# Patient Record
Sex: Female | Born: 1990 | Hispanic: Yes | Marital: Married | Smoking: Never smoker
Health system: Southern US, Community
[De-identification: ages and names within clinical notes are randomized; demographics above are authoritative.]

## PROBLEM LIST (undated history)

## (undated) DIAGNOSIS — D649 Anemia, unspecified: Secondary | ICD-10-CM

## (undated) DIAGNOSIS — R7303 Prediabetes: Secondary | ICD-10-CM

## (undated) HISTORY — DX: Anemia, unspecified: D64.9

## (undated) HISTORY — DX: Prediabetes: R73.03

---
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
## 2018-11-17 ENCOUNTER — Other Ambulatory Visit: Payer: Self-pay

## 2018-11-17 DIAGNOSIS — Z20822 Contact with and (suspected) exposure to covid-19: Secondary | ICD-10-CM

## 2018-11-19 LAB — SPECIMEN STATUS REPORT

## 2018-11-19 LAB — NOVEL CORONAVIRUS, NAA: SARS-CoV-2, NAA: NOT DETECTED

## 2020-12-21 ENCOUNTER — Other Ambulatory Visit: Payer: Self-pay

## 2020-12-21 ENCOUNTER — Ambulatory Visit
Admission: RE | Admit: 2020-12-21 | Discharge: 2020-12-21 | Disposition: A | Discharge status: Home / Self Care | Payer: Self-pay | Source: Ambulatory Visit | Attending: Emergency Medicine | Admitting: Emergency Medicine

## 2020-12-21 VITALS — BP 146/90 | HR 99 | Temp 98.6°F | Resp 16

## 2020-12-21 DIAGNOSIS — N390 Urinary tract infection, site not specified: Secondary | ICD-10-CM

## 2020-12-21 LAB — POCT URINALYSIS DIP (MANUAL ENTRY)
Bilirubin, UA: NEGATIVE
Glucose, UA: NEGATIVE mg/dL
Ketones, POC UA: NEGATIVE mg/dL
Nitrite, UA: NEGATIVE
Protein Ur, POC: 100 mg/dL — AB
Spec Grav, UA: 1.025 (ref 1.010–1.025)
Urobilinogen, UA: 0.2 E.U./dL
pH, UA: 6 (ref 5.0–8.0)

## 2020-12-21 MED ORDER — CEPHALEXIN 500 MG PO CAPS: 500.0000 mg | ORAL_CAPSULE | Freq: Two times a day (BID) | ORAL | 0 refills | Status: AC

## 2020-12-21 NOTE — Discharge Instructions (Signed)

## 2020-12-21 NOTE — ED Provider Notes (Signed)
Subjective:    Kristine Sanchez is a very pleasant 30 y.o. female who presents with concerns for UTI due to dysuria, urinary frequency, and lower abdominal pain for about a week. No unilateral back pain, vomiting, fever, vaginal discharge, concern for STD.  Past medical history, past surgical history, current medications reviewed.  Allergies: has No Known Allergies.  Review of Systems See HPI   Objective:     Vitals:   12/21/20 1121  BP: (!) 146/90  Pulse: 99  Resp: 16  Temp: 98.6 F (37 C)  SpO2: 99%     General: Appears well-developed and well-nourished. No acute distress.  Cardiovascular: Normal rate Pulm/Chest: No respiratory distress Abdominal: No CVAT.  Neurological: Alert and oriented to person, place, and time.  Skin: Skin is warm and dry.  Psychiatric: Normal mood, affect, behavior, and thought content.  GU:  Deferred secondary to self collect specimen  Laboratory:  Orders Placed This Encounter  Procedures   Urine Culture   POCT urinalysis dipstick   Results for orders placed or performed during the hospital encounter of 12/21/20  POCT urinalysis dipstick  Result Value Ref Range   Color, UA yellow yellow   Clarity, UA cloudy (A) clear   Glucose, UA negative negative mg/dL   Bilirubin, UA negative negative   Ketones, POC UA negative negative mg/dL   Spec Grav, UA 7.628 3.151 - 1.025   Blood, UA moderate (A) negative   pH, UA 6.0 5.0 - 8.0   Protein Ur, POC =100 (A) negative mg/dL   Urobilinogen, UA 0.2 0.2 or 1.0 E.U./dL   Nitrite, UA Negative Negative   Leukocytes, UA Large (3+) (A) Negative     Assessment:   1. Acute UTI - Urine Culture; Standing - Urine Culture - cephALEXin (KEFLEX) 500 MG capsule; Take 1 capsule (500 mg total) by mouth 2 (two) times daily for 7 days.  Dispense: 14 capsule; Refill: 0   Plan:   MDM: Patient presents with concerns for UTI due to dysuria, urinary frequency, and lower abdominal pain for about a week. No  unilateral back pain, vomiting, fever, vaginal discharge, concern for STD.  Urinalysis reveals cloudy urine, moderate blood, 100 protein and large leukocytes.  On chart review there is no history of urine culture, will treat with Keflex and send the patient's urine for culture today.  Advised that we will change treatment if warranted pending culture results.  Given urinalysis results, likely acute UTI.  Advised that she may use Uristat or Azo as needed for discomfort and to increase fluid intake.  Return to clinic or go to the ER with any fever, unilateral back pain or vomiting.  Patient verbalized understanding and agreed with plan.  Patient stable upon discharge.  Return as needed.    Discharge Instructions      You were seen today for an infection in the lower urinary tract. Your urine sample today was sent for a culture. The urine culture will show what type of bacteria grows and if you are on the appropriate antibiotic. If the antibiotic needs to be changed, you will receive a phone call from the follow up nurse who will give you more information. If you do not receive a call, then you are on the correct antibiotic.  Take antibiotics as directed. Finish course even if feeling better sooner. Drink plenty of clear fluids. Over the counter "Uristat" or "Azo Standard" may help your discomfort.  This will turn your urine dark orange or red and may  stain contacts (remove before using). You may experience 24 - 48 hours of continuing discomfort until medication controls the infection.  Return to clinic or go to the ER if you develop a fever, one-sided back pain, or vomiting as these are signs of a worsening infection.         Amalia Greenhouse, FNP 12/21/20 1125

## 2020-12-21 NOTE — ED Triage Notes (Signed)
Patient presents to Urgent Care with complaints of UTI symptoms x 1 week. Pt c/o of dysuria, increased urinary freq. and abdominal pain.   Denies fever or hematuria.

## 2020-12-23 LAB — URINE CULTURE

## 2023-01-26 ENCOUNTER — Ambulatory Visit
Admission: RE | Admit: 2023-01-26 | Discharge: 2023-01-26 | Disposition: A | Discharge status: Home / Self Care | Payer: Managed Care, Other (non HMO) | Source: Ambulatory Visit | Attending: Emergency Medicine | Admitting: Emergency Medicine

## 2023-01-26 VITALS — BP 109/76 | HR 81 | Temp 98.1°F | Resp 18 | Ht 63.0 in | Wt 270.0 lb

## 2023-01-26 DIAGNOSIS — R350 Frequency of micturition: Secondary | ICD-10-CM | POA: Diagnosis present

## 2023-01-26 LAB — POCT URINALYSIS DIP (MANUAL ENTRY)
Bilirubin, UA: NEGATIVE
Glucose, UA: NEGATIVE mg/dL
Ketones, POC UA: NEGATIVE mg/dL
Nitrite, UA: NEGATIVE
Protein Ur, POC: NEGATIVE mg/dL
Spec Grav, UA: 1.01 (ref 1.010–1.025)
Urobilinogen, UA: 0.2 U/dL
pH, UA: 6.5 (ref 5.0–8.0)

## 2023-01-26 MED ORDER — NITROFURANTOIN MONOHYD MACRO 100 MG PO CAPS: 100.0000 mg | ORAL_CAPSULE | Freq: Two times a day (BID) | ORAL | 0 refills | Status: DC

## 2023-01-26 NOTE — ED Provider Notes (Signed)
Renaldo Fiddler    CSN: [id number] Arrival date & time: 01/26/23  1718      History   Chief Complaint Chief Complaint  Patient presents with   Urinary Frequency    Maybe UTI - Entered by patient    HPI Kristine Sanchez is a 32 y.o. female.   Patient presents for evaluation of urinary frequency, urgency and dysuria present for 7 days.  Has not attempted treatment.  Last menstrual period 1 week ago no concern for pregnancy.  Denies all vaginal symptoms, lower abdominal pain or pressure, flank pain or fever.  History reviewed. No pertinent past medical history.  There are no problems to display for this patient.   History reviewed. No pertinent surgical history.  OB History   No obstetric history on file.      Home Medications    Prior to Admission medications   Medication Sig Start Date End Date Taking? Authorizing Provider  nitrofurantoin, macrocrystal-monohydrate, (MACROBID) 100 MG capsule Take 1 capsule (100 mg total) by mouth 2 (two) times daily. 01/26/23  Yes WhiteElita Boone, NP    Family History No family history on file.  Social History Social History   Tobacco Use   Smoking status: Never   Smokeless tobacco: Never  Vaping Use   Vaping status: Never Used  Substance Use Topics   Alcohol use: Never   Drug use: Never     Allergies   Ibuprofen   Review of Systems Review of Systems   Physical Exam Triage Vital Signs ED Triage Vitals  Encounter Vitals Group     BP 01/26/23 1735 109/76     Systolic BP Percentile --      Diastolic BP Percentile --      Pulse Rate 01/26/23 1735 81     Resp 01/26/23 1735 18     Temp 01/26/23 1735 98.1 F (36.7 C)     Temp Source 01/26/23 1735 Oral     SpO2 01/26/23 1735 99 %     Weight 01/26/23 1731 270 lb (122.5 kg)     Height 01/26/23 1731 5\' 3"  (1.6 m)     Head Circumference --      Peak Flow --      Pain Score 01/26/23 1731 6     Pain Loc --      Pain Education --      Exclude from Growth  Chart --    No data found.  Updated Vital Signs BP 109/76 (BP Location: Right Arm)   Pulse 81   Temp 98.1 F (36.7 C) (Oral)   Resp 18   Ht 5\' 3"  (1.6 m)   Wt 270 lb (122.5 kg)   LMP 01/19/2023   SpO2 99%   BMI 47.83 kg/m   Visual Acuity Right Eye Distance:   Left Eye Distance:   Bilateral Distance:    Right Eye Near:   Left Eye Near:    Bilateral Near:     Physical Exam Constitutional:      Appearance: Normal appearance.  Eyes:     Extraocular Movements: Extraocular movements intact.  Pulmonary:     Effort: Pulmonary effort is normal.  Abdominal:     General: Abdomen is flat. Bowel sounds are normal. There is no distension.     Palpations: Abdomen is soft.     Tenderness: There is no abdominal tenderness. There is no right CVA tenderness, left CVA tenderness or guarding.  Neurological:     Mental Status: She  is alert and oriented to person, place, and time. Mental status is at baseline.      UC Treatments / Results  Labs (all labs ordered are listed, but only abnormal results are displayed) Labs Reviewed  POCT URINALYSIS DIP (MANUAL ENTRY) - Abnormal; Notable for the following components:      Result Value   Color, UA light yellow (*)    Blood, UA trace-lysed (*)    Leukocytes, UA Small (1+) (*)    All other components within normal limits  URINE CULTURE    EKG   Radiology No results found.  Procedures Procedures (including critical care time)  Medications Ordered in UC Medications - No data to display  Initial Impression / Assessment and Plan / UC Course  I have reviewed the triage vital signs and the nursing notes.  Pertinent labs & imaging results that were available during my care of the patient were reviewed by me and considered in my medical decision making (see chart for details).  Urinary frequency  Urinalysis showing leukocytes, negative for nitrates, sent for culture, discussed findings with patient, no abdominal tenderness or CVA  tenderness on exam, stable for outpatient management, prophylactically treating as she is symptomatic with Macrobid, discussed additional supportive measures and advised follow-up if symptoms continue to persist worsen or recur Final Clinical Impressions(s) / UC Diagnoses   Final diagnoses:  Urinary frequency     Discharge Instructions      Your urinalysis shows Kristine Sanchez blood cells but at this time shows no bacteria, your urine will be sent to the lab to determine exactly which bacteria is present, if any changes need to be made to your medications you will be notified  Begin use of Macrobid very morning and every evening for 5 days  You may use over-the-counter Pyridium to help minimize your symptoms until antibiotic removes bacteria, this medication will turn your urine orange  Increase your fluid intake through use of water  As always practice good hygiene, wiping front to back and avoidance of scented vaginal products to prevent further irritation  If symptoms continue to persist after use of medication or recur please follow-up with urgent care or your primary doctor as needed    ED Prescriptions     Medication Sig Dispense Auth. Provider   nitrofurantoin, macrocrystal-monohydrate, (MACROBID) 100 MG capsule Take 1 capsule (100 mg total) by mouth 2 (two) times daily. 10 capsule Valinda Hoar, NP      PDMP not reviewed this encounter.   Valinda Hoar, NP 01/26/23 1807

## 2023-01-26 NOTE — ED Triage Notes (Signed)
Patient in office today chief complaint frequent urination,painful with sensation x1wk  WGN:FAOZ  Denies: adb. Pain, fever and n/v

## 2023-01-26 NOTE — Discharge Instructions (Addendum)
Your urinalysis shows Dawsyn Zurn blood cells but at this time shows no bacteria, your urine will be sent to the lab to determine exactly which bacteria is present, if any changes need to be made to your medications you will be notified  Begin use of Macrobid very morning and every evening for 5 days  You may use over-the-counter Pyridium to help minimize your symptoms until antibiotic removes bacteria, this medication will turn your urine orange  Increase your fluid intake through use of water  As always practice good hygiene, wiping front to back and avoidance of scented vaginal products to prevent further irritation  If symptoms continue to persist after use of medication or recur please follow-up with urgent care or your primary doctor as needed

## 2023-01-28 ENCOUNTER — Telehealth: Payer: Self-pay

## 2023-01-28 LAB — URINE CULTURE: Culture: >=100000 — AB

## 2023-01-28 MED ORDER — SULFAMETHOXAZOLE-TRIMETHOPRIM 800-160 MG PO TABS: 1.0000 | ORAL_TABLET | Freq: Two times a day (BID) | ORAL | 0 refills | Status: AC

## 2023-01-28 MED ORDER — PHENAZOPYRIDINE HCL 200 MG PO TABS: 200.0000 mg | ORAL_TABLET | Freq: Three times a day (TID) | ORAL | 0 refills | Status: AC

## 2023-01-28 NOTE — Telephone Encounter (Signed)
TC to pt, ID verified. RE: abx change. Pt adv to d/c current abx and Bactrim BID x 5 days sent to pharmacy on file

## 2023-05-18 ENCOUNTER — Other Ambulatory Visit: Payer: Self-pay | Admitting: Family Medicine

## 2023-05-18 DIAGNOSIS — N6314 Unspecified lump in the right breast, lower inner quadrant: Secondary | ICD-10-CM

## 2023-05-18 DIAGNOSIS — N644 Mastodynia: Secondary | ICD-10-CM

## 2023-05-20 ENCOUNTER — Ambulatory Visit
Admission: RE | Admit: 2023-05-20 | Discharge: 2023-05-20 | Disposition: A | Discharge status: Home / Self Care | Payer: Managed Care, Other (non HMO) | Source: Ambulatory Visit | Attending: Family Medicine | Admitting: Family Medicine

## 2023-05-20 ENCOUNTER — Ambulatory Visit
Admission: RE | Admit: 2023-05-20 | Discharge: 2023-05-20 | Discharge status: Home / Self Care | Payer: Managed Care, Other (non HMO) | Source: Ambulatory Visit | Attending: Family Medicine | Admitting: Family Medicine

## 2023-05-20 DIAGNOSIS — N6314 Unspecified lump in the right breast, lower inner quadrant: Secondary | ICD-10-CM | POA: Diagnosis present

## 2023-05-20 DIAGNOSIS — N644 Mastodynia: Secondary | ICD-10-CM | POA: Diagnosis present

## 2023-05-24 ENCOUNTER — Inpatient Hospital Stay: Payer: Managed Care, Other (non HMO)

## 2023-05-24 ENCOUNTER — Inpatient Hospital Stay: Payer: Managed Care, Other (non HMO) | Attending: Oncology | Admitting: Oncology

## 2023-05-24 ENCOUNTER — Encounter: Payer: Self-pay | Admitting: Oncology

## 2023-05-24 VITALS — BP 138/88 | HR 93 | Temp 98.5°F | Resp 18

## 2023-05-24 DIAGNOSIS — D509 Iron deficiency anemia, unspecified: Secondary | ICD-10-CM | POA: Insufficient documentation

## 2023-05-24 NOTE — Assessment & Plan Note (Signed)
 Labs are reviewed and discussed with patient. Results are consistent with iron deficiency anemia.  I discussed about option IV Venofer treatments. I discussed about the potential risks including but not limited to allergic reactions/infusion reactions including anaphylactic reactions, phlebitis, high blood pressure, wheezing, SOB, skin rash, weight gain,dark urine, leg swelling, back pain, headache, nausea and fatigue, etc.  Plan IV venofer weekly x 4  patient agrees with the plan.

## 2023-05-24 NOTE — Progress Notes (Signed)
 Hematology/Oncology Consult note [phone] [phone]        REFERRING PROVIDER: Alm Bustard, NP   CHIEF COMPLAINTS/REASON FOR VISIT:  Evaluation of iron deficiency anemia.    ASSESSMENT & PLAN:   IDA (iron deficiency anemia) Labs are reviewed and discussed with patient. Results are consistent with iron deficiency anemia.  I discussed about option IV Venofer treatments. I discussed about the potential risks including but not limited to allergic reactions/infusion reactions including anaphylactic reactions, phlebitis, high blood pressure, wheezing, SOB, skin rash, weight gain,dark urine, leg swelling, back pain, headache, nausea and fatigue, etc.  Plan IV venofer weekly x 4  patient agrees with the plan.    Orders Placed This Encounter  Procedures   Pregnancy, urine    Standing Status:   Standing    Number of Occurrences:   5    Expiration Date:   05/23/2024   Follow up in 3 months.  All questions were answered. The patient knows to call the clinic with any problems, questions or concerns.  Rickard Patience, MD, PhD [facility] Hematology Oncology 05/24/2023   HISTORY OF PRESENTING ILLNESS:   Kristine Sanchez is a  33 y.o.  female with PMH listed below was seen in consultation at the request of  Fields, Glenda L, NP  for evaluation of iron deficiency anemia.  She has been experiencing iron deficiency anemia for some time, as indicated by recent blood work. Her hemoglobin level is currently at 11, and her iron level is at 4, which is consistent with iron deficiency. She has been taking oral iron supplements, but her hemoglobin remains low. She has not previously received iron treatments such as infusions.  Her menstrual periods are not excessively heavy, with the first two days being the heaviest, but not prolonged. No history of stomach surgery, stomach pain, blood in the stool, or dark stools, which could indicate gastrointestinal bleeding.  She is  sexually active but not using contraceptive measures, and she has two children. She recalls being told about a heart murmur during a past pregnancy, but no further details were provided.  No stomach pain, blood in the stool, or dark stools. She feels tired.    MEDICAL HISTORY:  Past Medical History:  Diagnosis Date   Anemia    Prediabetes     SURGICAL HISTORY: History reviewed. No pertinent surgical history.  SOCIAL HISTORY: Social History   Socioeconomic History   Marital status: Married    Spouse name: Not on file   Number of children: Not on file   Years of education: Not on file   Highest education level: Not on file  Occupational History   Not on file  Tobacco Use   Smoking status: Never   Smokeless tobacco: Never  Vaping Use   Vaping status: Never Used  Substance and Sexual Activity   Alcohol use: Never   Drug use: Never   Sexual activity: Not on file  Other Topics Concern   Not on file  Social History Narrative   Not on file   Social Drivers of Health   Financial Resource Strain: Low Risk  (02/15/2023)   Received from [facility]   Overall Financial Resource Strain (CARDIA)    Difficulty of Paying Living Expenses: Not hard at all  Food Insecurity: No Food Insecurity (05/24/2023)   Hunger Vital Sign    Worried About Running Out of Food in the Last Year: Never true    Ran Out of Food in  the Last Year: Never true  Transportation Needs: No Transportation Needs (05/24/2023)   PRAPARE - Administrator, Civil Service (Medical): No    Lack of Transportation (Non-Medical): No  Physical Activity: Not on file  Stress: Not on file  Social Connections: Not on file  Intimate Partner Violence: Not At Risk (05/24/2023)   Humiliation, Afraid, Rape, and Kick questionnaire    Fear of Current or Ex-Partner: No    Emotionally Abused: No    Physically Abused: No    Sexually Abused: No    FAMILY HISTORY: Family History  Problem Relation  Age of Onset   Diabetes Father    Breast cancer Neg Hx     ALLERGIES:  is allergic to ibuprofen.  MEDICATIONS:  Current Outpatient Medications  Medication Sig Dispense Refill   ferrous gluconate (FERGON) 324 MG tablet Take 324 mg by mouth daily with breakfast.     Vitamin D, Ergocalciferol, (DRISDOL) 1.25 MG (50000 UNIT) CAPS capsule Take 50,000 Units by mouth once a week.     No current facility-administered medications for this visit.    Review of Systems  Constitutional:  Positive for fatigue. Negative for appetite change, chills and fever.  HENT:   Negative for hearing loss and voice change.   Eyes:  Negative for eye problems.  Respiratory:  Negative for chest tightness and cough.   Cardiovascular:  Negative for chest pain.  Gastrointestinal:  Negative for abdominal distention, abdominal pain and blood in stool.  Endocrine: Negative for hot flashes.  Genitourinary:  Negative for difficulty urinating and frequency.   Musculoskeletal:  Negative for arthralgias.  Skin:  Negative for itching and rash.  Neurological:  Negative for extremity weakness.  Hematological:  Negative for adenopathy.  Psychiatric/Behavioral:  Negative for confusion.    PHYSICAL EXAMINATION: ECOG PERFORMANCE STATUS: 0 - Asymptomatic Vitals:   05/24/23 1539  BP: 138/88  Pulse: 93  Resp: 18  Temp: 98.5 F (36.9 C)   There were no vitals filed for this visit.  Physical Exam Constitutional:      General: She is not in acute distress. HENT:     Head: Normocephalic and atraumatic.  Eyes:     General: No scleral icterus. Cardiovascular:     Rate and Rhythm: Normal rate and regular rhythm.     Heart sounds: Murmur heard.  Pulmonary:     Effort: Pulmonary effort is normal. No respiratory distress.     Breath sounds: No wheezing.  Abdominal:     General: Bowel sounds are normal. There is no distension.     Palpations: Abdomen is soft.  Musculoskeletal:        General: No deformity. Normal range  of motion.     Cervical back: Normal range of motion and neck supple.  Skin:    General: Skin is warm and dry.     Findings: No erythema or rash.  Neurological:     Mental Status: She is alert and oriented to person, place, and time. Mental status is at baseline.     Cranial Nerves: No cranial nerve deficit.     Coordination: Coordination normal.  Psychiatric:        Mood and Affect: Mood normal.     LABORATORY DATA:  I have reviewed the data as listed     No data to display             No data to display  RADIOGRAPHIC STUDIES: I have personally reviewed the radiological images as listed and agreed with the findings in the report. MM 3D DIAGNOSTIC MAMMOGRAM BILATERAL BREAST Result Date: 05/20/2023 CLINICAL DATA:  Here for evaluation of a palpable and tender area felt by the patient in the right breast. EXAM: DIGITAL DIAGNOSTIC BILATERAL MAMMOGRAM WITH TOMOSYNTHESIS AND CAD; ULTRASOUND RIGHT BREAST LIMITED TECHNIQUE: Bilateral digital diagnostic mammography and breast tomosynthesis was performed. The images were evaluated with computer-aided detection. ; Targeted ultrasound examination of the right breast was performed COMPARISON:  This is the patient's baseline mammogram. ACR Breast Density Category a: The breasts are almost entirely fatty. FINDINGS: Spot compression was obtained over the palpable and tender area of concern in the right breast. No suspicious mammographic finding is identified in this area. No suspicious mass, microcalcification, or other finding is identified in either breast. Targeted right breast ultrasound was performed in the palpable and tender area of concern from the 4-6 o'clock position. No suspicious solid or cystic mass is identified. No findings to explain the patient's symptoms. IMPRESSION: No evidence of malignancy or findings to explain the patient's symptoms. RECOMMENDATION: Any further workup of the patient's symptoms should be based on the  clinical assessment. Recommend routine annual screening mammogram beginning at age 65. I have discussed the findings and recommendations with the patient. If applicable, a reminder letter will be sent to the patient regarding the next appointment. BI-RADS CATEGORY  1: Negative. Electronically Signed   By: Romona Curls M.D.   On: 05/20/2023 15:29   [place] LIMITED ULTRASOUND INCLUDING AXILLA RIGHT BREAST Result Date: 05/20/2023 CLINICAL DATA:  Here for evaluation of a palpable and tender area felt by the patient in the right breast. EXAM: DIGITAL DIAGNOSTIC BILATERAL MAMMOGRAM WITH TOMOSYNTHESIS AND CAD; ULTRASOUND RIGHT BREAST LIMITED TECHNIQUE: Bilateral digital diagnostic mammography and breast tomosynthesis was performed. The images were evaluated with computer-aided detection. ; Targeted ultrasound examination of the right breast was performed COMPARISON:  This is the patient's baseline mammogram. ACR Breast Density Category a: The breasts are almost entirely fatty. FINDINGS: Spot compression was obtained over the palpable and tender area of concern in the right breast. No suspicious mammographic finding is identified in this area. No suspicious mass, microcalcification, or other finding is identified in either breast. Targeted right breast ultrasound was performed in the palpable and tender area of concern from the 4-6 o'clock position. No suspicious solid or cystic mass is identified. No findings to explain the patient's symptoms. IMPRESSION: No evidence of malignancy or findings to explain the patient's symptoms. RECOMMENDATION: Any further workup of the patient's symptoms should be based on the clinical assessment. Recommend routine annual screening mammogram beginning at age 39. I have discussed the findings and recommendations with the patient. If applicable, a reminder letter will be sent to the patient regarding the next appointment. BI-RADS CATEGORY  1: Negative. Electronically Signed   By: Romona Curls  M.D.   On: 05/20/2023 15:29

## 2023-06-04 ENCOUNTER — Inpatient Hospital Stay: Payer: Managed Care, Other (non HMO)

## 2023-06-04 VITALS — BP 123/76 | HR 80 | Temp 98.2°F | Resp 16

## 2023-06-04 DIAGNOSIS — D509 Iron deficiency anemia, unspecified: Secondary | ICD-10-CM

## 2023-06-04 MED ORDER — IRON SUCROSE 20 MG/ML IV SOLN
200.0000 mg | Freq: Once | INTRAVENOUS | Status: AC
  Administered 2023-06-04: 200 mg via INTRAVENOUS
  Filled 2023-06-04: qty 10

## 2023-06-04 NOTE — Progress Notes (Signed)
 Pt refused urine pregnancy test.  Pt states menstrual cycle began today.

## 2023-06-04 NOTE — Patient Instructions (Signed)

## 2023-06-11 ENCOUNTER — Inpatient Hospital Stay: Payer: Managed Care, Other (non HMO) | Attending: Oncology

## 2023-06-11 ENCOUNTER — Inpatient Hospital Stay: Payer: Managed Care, Other (non HMO)

## 2023-06-11 VITALS — BP 123/78 | HR 74 | Temp 98.6°F | Resp 18

## 2023-06-11 DIAGNOSIS — D509 Iron deficiency anemia, unspecified: Secondary | ICD-10-CM

## 2023-06-11 LAB — PREGNANCY, URINE: Preg Test, Ur: NEGATIVE

## 2023-06-11 MED ORDER — SODIUM CHLORIDE 0.9% FLUSH
10.0000 mL | Freq: Once | INTRAVENOUS | Status: AC | PRN
  Administered 2023-06-11: 10 mL
  Filled 2023-06-11: qty 10

## 2023-06-11 MED ORDER — IRON SUCROSE 20 MG/ML IV SOLN
200.0000 mg | Freq: Once | INTRAVENOUS | Status: AC
  Administered 2023-06-11: 200 mg via INTRAVENOUS
  Filled 2023-06-11: qty 10

## 2023-06-18 ENCOUNTER — Inpatient Hospital Stay: Payer: Managed Care, Other (non HMO)

## 2023-06-18 VITALS — BP 119/70 | HR 93 | Temp 98.2°F | Resp 16

## 2023-06-18 DIAGNOSIS — D509 Iron deficiency anemia, unspecified: Secondary | ICD-10-CM

## 2023-06-18 LAB — PREGNANCY, URINE: Preg Test, Ur: NEGATIVE

## 2023-06-18 MED ORDER — IRON SUCROSE 20 MG/ML IV SOLN
200.0000 mg | Freq: Once | INTRAVENOUS | Status: AC
  Administered 2023-06-18: 200 mg via INTRAVENOUS
  Filled 2023-06-18: qty 10

## 2023-06-18 NOTE — Patient Instructions (Signed)

## 2023-06-18 NOTE — Progress Notes (Signed)
 Pt tolerated treatment without concerns.  VSS.  Pt refused 30 minute post observation.  Pt understands risks.

## 2023-06-25 ENCOUNTER — Inpatient Hospital Stay: Payer: Managed Care, Other (non HMO)

## 2023-06-25 VITALS — BP 113/73 | HR 79 | Temp 97.6°F | Resp 18

## 2023-06-25 DIAGNOSIS — D509 Iron deficiency anemia, unspecified: Secondary | ICD-10-CM | POA: Diagnosis not present

## 2023-06-25 LAB — PREGNANCY, URINE: Preg Test, Ur: NEGATIVE

## 2023-06-25 MED ORDER — IRON SUCROSE 20 MG/ML IV SOLN
200.0000 mg | Freq: Once | INTRAVENOUS | Status: AC
  Administered 2023-06-25: 200 mg via INTRAVENOUS

## 2023-06-25 MED ORDER — SODIUM CHLORIDE 0.9% FLUSH
10.0000 mL | Freq: Once | INTRAVENOUS | Status: AC | PRN
Start: 2023-06-25 — End: 2023-06-25
  Administered 2023-06-25: 10 mL
  Filled 2023-06-25: qty 10

## 2023-06-25 NOTE — Progress Notes (Signed)
 Patient declined to wait the 30 minutes for post iron infusion observation today. Tolerated infusion well. VSS.

## 2023-08-20 ENCOUNTER — Encounter: Payer: Self-pay | Admitting: Oncology

## 2023-08-20 ENCOUNTER — Inpatient Hospital Stay: Payer: Managed Care, Other (non HMO)

## 2023-08-20 ENCOUNTER — Inpatient Hospital Stay: Payer: Managed Care, Other (non HMO) | Attending: Oncology | Admitting: Oncology

## 2023-08-20 VITALS — BP 119/72 | HR 81 | Temp 97.8°F | Resp 18 | Wt 276.0 lb

## 2023-08-20 DIAGNOSIS — D509 Iron deficiency anemia, unspecified: Secondary | ICD-10-CM | POA: Diagnosis present

## 2023-08-20 NOTE — Assessment & Plan Note (Addendum)
 Labs are reviewed and discussed with patient. Hemoglobin and iron  levels have normalized.  Hold off additional IV Venofer .

## 2023-08-20 NOTE — Progress Notes (Signed)
 Patient is doing well, no new questions or concerns for the doctor today.

## 2023-08-20 NOTE — Progress Notes (Signed)
 Hematology/Oncology Progress note [phone] [phone]           REFERRING PROVIDER: Will Hare, NP   CHIEF COMPLAINTS/REASON FOR VISIT:  iron  deficiency anemia.    ASSESSMENT & PLAN:   IDA (iron  deficiency anemia) Labs are reviewed and discussed with patient. Hemoglobin and iron  levels have normalized.  Hold off additional IV Venofer .    No orders of the defined types were placed in this encounter. Follow up PRN All questions were answered. The patient knows to call the clinic with any problems, questions or concerns.  Timmy Forbes, MD, PhD [facility] Hematology Oncology 08/20/2023   HISTORY OF PRESENTING ILLNESS:   Kristine Sanchez is a  33 y.o.  female with PMH listed below was seen in consultation at the request of  Fields, Glenda L, NP  for evaluation of iron  deficiency anemia.  She has been experiencing iron  deficiency anemia for some time, as indicated by recent blood work. Her hemoglobin level is currently at 11, and her iron  level is at 4, which is consistent with iron  deficiency. She has been taking oral iron  supplements, but her hemoglobin remains low. She has not previously received iron  treatments such as infusions.  Her menstrual periods are not excessively heavy, with the first two days being the heaviest, but not prolonged. No history of stomach surgery, stomach pain, blood in the stool, or dark stools, which could indicate gastrointestinal bleeding.  She is sexually active but not using contraceptive measures, and she has two children. She recalls being told about a heart murmur during a past pregnancy, but no further details were provided.  No stomach pain, blood in the stool, or dark stools. She feels tired.  INTERVAL HISTORY Kristine Sanchez is a 33 y.o. female who has above history reviewed by me today presents for follow up visit for  Iron  deficiency anemia.  Her fatigue level has improved.  Tolerated IV venofer  treatment  well.   MEDICAL HISTORY:  Past Medical History:  Diagnosis Date   Anemia    Prediabetes     SURGICAL HISTORY: History reviewed. No pertinent surgical history.  SOCIAL HISTORY: Social History   Socioeconomic History   Marital status: Married    Spouse name: Not on file   Number of children: Not on file   Years of education: Not on file   Highest education level: Not on file  Occupational History   Not on file  Tobacco Use   Smoking status: Never   Smokeless tobacco: Never  Vaping Use   Vaping status: Never Used  Substance and Sexual Activity   Alcohol use: Never   Drug use: Never   Sexual activity: Not on file  Other Topics Concern   Not on file  Social History Narrative   Not on file   Social Drivers of Health   Financial Resource Strain: Low Risk  (08/17/2023)   [facility] from [facility]   Overall Financial Resource Strain (CARDIA)    Difficulty of Paying Living Expenses: Not hard at all  Food Insecurity: No Food Insecurity (08/17/2023)   Received from [facility]   Hunger Vital Sign    Worried About Running Out of Food in the Last Year: Never true    Ran Out of Food in the Last Year: Never true  Transportation Needs: No Transportation Needs (08/17/2023)   Received from [facility] - Transportation    In the past 12 months,  has lack of transportation kept you from medical appointments or from getting medications?: No    Lack of Transportation (Non-Medical): No  Physical Activity: Not on file  Stress: Not on file  Social Connections: Not on file  Intimate Partner Violence: Not At Risk (05/24/2023)   Humiliation, Afraid, Rape, and Kick questionnaire    Fear of Current or Ex-Partner: No    Emotionally Abused: No    Physically Abused: No    Sexually Abused: No    FAMILY HISTORY: Family History  Problem Relation Age of Onset   Diabetes Father    Breast cancer Neg Hx     ALLERGIES:  is  allergic to ibuprofen.  MEDICATIONS:  Current Outpatient Medications  Medication Sig Dispense Refill   ciclopirox (PENLAC) 8 % solution Apply topically.     Vitamin D, Ergocalciferol, (DRISDOL) 1.25 MG (50000 UNIT) CAPS capsule Take 50,000 Units by mouth once a week.     ferrous gluconate (FERGON) 324 MG tablet Take 324 mg by mouth daily with breakfast. (Patient not taking: Reported on 08/20/2023)     No current facility-administered medications for this visit.    Review of Systems  Constitutional:  Positive for fatigue. Negative for appetite change, chills and fever.  HENT:   Negative for hearing loss and voice change.   Eyes:  Negative for eye problems.  Respiratory:  Negative for chest tightness and cough.   Cardiovascular:  Negative for chest pain.  Gastrointestinal:  Negative for abdominal distention, abdominal pain and blood in stool.  Endocrine: Negative for hot flashes.  Genitourinary:  Negative for difficulty urinating and frequency.   Musculoskeletal:  Negative for arthralgias.  Skin:  Negative for itching and rash.  Neurological:  Negative for extremity weakness.  Hematological:  Negative for adenopathy.  Psychiatric/Behavioral:  Negative for confusion.    PHYSICAL EXAMINATION: ECOG PERFORMANCE STATUS: 0 - Asymptomatic Vitals:   08/20/23 1220  BP: 119/72  Pulse: 81  Resp: 18  Temp: 97.8 F (36.6 C)  SpO2: 99%   Filed Weights   08/20/23 1220  Weight: 276 lb (125.2 kg)    Physical Exam Constitutional:      General: She is not in acute distress. HENT:     Head: Normocephalic and atraumatic.  Eyes:     General: No scleral icterus. Cardiovascular:     Rate and Rhythm: Normal rate.  Pulmonary:     Effort: Pulmonary effort is normal. No respiratory distress.  Abdominal:     General: There is no distension.  Musculoskeletal:        General: No deformity. Normal range of motion.     Cervical back: Normal range of motion and neck supple.  Skin:    Findings:  No erythema or rash.  Neurological:     Mental Status: She is alert and oriented to person, place, and time. Mental status is at baseline.  Psychiatric:        Mood and Affect: Mood normal.     LABORATORY DATA:  I have reviewed the data as listed     No data to display             No data to display            RADIOGRAPHIC STUDIES: I have personally reviewed the radiological images as listed and agreed with the findings in the report. No results found.

## 2023-08-20 NOTE — Progress Notes (Signed)
 No Venofer  Today

## 2024-02-21 ENCOUNTER — Other Ambulatory Visit: Payer: Self-pay | Admitting: Family Medicine

## 2024-02-21 DIAGNOSIS — R1011 Right upper quadrant pain: Secondary | ICD-10-CM

## 2024-03-06 ENCOUNTER — Ambulatory Visit
Admission: RE | Admit: 2024-03-06 | Discharge: 2024-03-06 | Disposition: A | Source: Ambulatory Visit | Attending: Family Medicine | Admitting: Family Medicine

## 2024-03-06 DIAGNOSIS — R1011 Right upper quadrant pain: Secondary | ICD-10-CM | POA: Diagnosis present

## 2024-03-15 ENCOUNTER — Other Ambulatory Visit: Payer: Self-pay | Admitting: Family Medicine

## 2024-03-15 DIAGNOSIS — N63 Unspecified lump in unspecified breast: Secondary | ICD-10-CM

## 2024-03-27 ENCOUNTER — Ambulatory Visit
Admission: RE | Admit: 2024-03-27 | Discharge: 2024-03-27 | Disposition: A | Discharge status: Home / Self Care | Source: Ambulatory Visit | Attending: Family Medicine | Admitting: Family Medicine

## 2024-03-27 DIAGNOSIS — N6322 Unspecified lump in the left breast, upper inner quadrant: Secondary | ICD-10-CM | POA: Insufficient documentation

## 2024-03-27 DIAGNOSIS — N63 Unspecified lump in unspecified breast: Secondary | ICD-10-CM | POA: Diagnosis present
